# Patient Record
Sex: Female | Born: 1965 | Race: White | Hispanic: No | Marital: Married | Smoking: Former smoker
Health system: Southern US, Community
[De-identification: ages and names within clinical notes are randomized; demographics above are authoritative.]

## PROBLEM LIST (undated history)

## (undated) DIAGNOSIS — T7840XA Allergy, unspecified, initial encounter: Secondary | ICD-10-CM

## (undated) DIAGNOSIS — F419 Anxiety disorder, unspecified: Secondary | ICD-10-CM

## (undated) DIAGNOSIS — E079 Disorder of thyroid, unspecified: Secondary | ICD-10-CM

## (undated) HISTORY — DX: Anxiety disorder, unspecified: F41.9

## (undated) HISTORY — DX: Allergy, unspecified, initial encounter: T78.40XA

## (undated) HISTORY — PX: ENDOMETRIAL BIOPSY: SHX622

## (undated) HISTORY — PX: DILATION AND EVACUATION: SHX1459

## (undated) HISTORY — PX: LAPAROSCOPY: SHX197

## (undated) HISTORY — DX: Disorder of thyroid, unspecified: E07.9

---

## 1998-04-27 ENCOUNTER — Other Ambulatory Visit: Admission: RE | Admit: 1998-04-27 | Discharge: 1998-04-27 | Payer: Self-pay | Admitting: Gynecology

## 1999-05-20 ENCOUNTER — Encounter (INDEPENDENT_AMBULATORY_CARE_PROVIDER_SITE_OTHER): Payer: Self-pay | Admitting: Specialist

## 1999-05-20 ENCOUNTER — Other Ambulatory Visit: Admission: RE | Admit: 1999-05-20 | Discharge: 1999-05-20 | Payer: Self-pay | Admitting: Gynecology

## 2000-04-04 ENCOUNTER — Other Ambulatory Visit: Admission: RE | Admit: 2000-04-04 | Discharge: 2000-04-04 | Payer: Self-pay | Admitting: Obstetrics and Gynecology

## 2000-05-19 ENCOUNTER — Ambulatory Visit (HOSPITAL_COMMUNITY): Admission: RE | Admit: 2000-05-19 | Discharge: 2000-05-19 | Payer: Self-pay | Admitting: Obstetrics and Gynecology

## 2000-05-19 ENCOUNTER — Encounter: Payer: Self-pay | Admitting: Obstetrics and Gynecology

## 2000-10-13 ENCOUNTER — Inpatient Hospital Stay (HOSPITAL_COMMUNITY): Admission: AD | Admit: 2000-10-13 | Discharge: 2000-10-13 | Payer: Self-pay | Admitting: Obstetrics and Gynecology

## 2000-10-19 ENCOUNTER — Inpatient Hospital Stay (HOSPITAL_COMMUNITY): Admission: AD | Admit: 2000-10-19 | Discharge: 2000-10-21 | Payer: Self-pay | Admitting: Obstetrics and Gynecology

## 2000-10-19 ENCOUNTER — Encounter (INDEPENDENT_AMBULATORY_CARE_PROVIDER_SITE_OTHER): Payer: Self-pay

## 2000-12-20 ENCOUNTER — Encounter: Payer: Self-pay | Admitting: Obstetrics and Gynecology

## 2000-12-20 ENCOUNTER — Ambulatory Visit (HOSPITAL_COMMUNITY): Admission: RE | Admit: 2000-12-20 | Discharge: 2000-12-20 | Payer: Self-pay | Admitting: Obstetrics and Gynecology

## 2002-04-02 ENCOUNTER — Other Ambulatory Visit: Admission: RE | Admit: 2002-04-02 | Discharge: 2002-04-02 | Payer: Self-pay | Admitting: Gynecology

## 2003-05-19 ENCOUNTER — Other Ambulatory Visit: Admission: RE | Admit: 2003-05-19 | Discharge: 2003-05-19 | Payer: Self-pay | Admitting: Gynecology

## 2004-09-10 ENCOUNTER — Other Ambulatory Visit: Admission: RE | Admit: 2004-09-10 | Discharge: 2004-09-10 | Payer: Self-pay | Admitting: Gynecology

## 2005-02-10 ENCOUNTER — Ambulatory Visit (HOSPITAL_COMMUNITY): Admission: RE | Admit: 2005-02-10 | Discharge: 2005-02-10 | Payer: Self-pay | Admitting: Obstetrics and Gynecology

## 2005-02-10 ENCOUNTER — Encounter (INDEPENDENT_AMBULATORY_CARE_PROVIDER_SITE_OTHER): Payer: Self-pay | Admitting: Specialist

## 2005-10-31 ENCOUNTER — Other Ambulatory Visit: Admission: RE | Admit: 2005-10-31 | Discharge: 2005-10-31 | Payer: Self-pay | Admitting: Gynecology

## 2006-12-11 ENCOUNTER — Other Ambulatory Visit: Admission: RE | Admit: 2006-12-11 | Discharge: 2006-12-11 | Payer: Self-pay | Admitting: Gynecology

## 2010-06-18 NOTE — Discharge Summary (Signed)
[facility]  Patient:    Kaitlin Black, Kaitlin Black Visit Number: [id number] MRN: [id number]          Service Type: OBS Location: 910A [id number] [id number] Attending Physician:  Michaele Offer Dictated by:   Malachi Pro. Ambrose Mantle, M.D. Admit Date:  10/19/2000 Discharge Date: 10/21/2000                             Discharge Summary  HISTORY OF PRESENT ILLNESS:   This is a 45 year old white female, para 0-0-2-0, gravida 3, 40 weeks by estimated gestational age by intrauterine insemination on January 27, 2000, who presented for induction with a favorable cervix. The patient had good fetal movement, no vaginal bleeding, or ruptured membranes and had occasional contractions.  PRENATAL CARE:                Advanced maternal age with borderline increased maternal serum alpha-fetoprotein with normal ultrasound and declined amniocentesis. AntiTPO antibodies, was on baby aspirin and prednisone first trimester. ANA negative. TSH normal. The patient had mildly increased blood pressures since September 13 with blood pressures of 130-140/80-90 with normal labs and no symptoms.  Blood group and type: B positive with a negative antibody, RPR nonreactive, rubella immune, hepatitis B surface antigen negative, HIV negative, GC and Chlamydia negative, one-hour glucola 108, group B strep was negative.  OBSTETRIC HISTORY:            In 1995 the patient had an early abortion. In 2001 she had a spontaneous abortion after IVF, questionably due to antiTPO antibodies.  GYNECOLOGIC HISTORY:          The patient had a history of Chlamydia. She had a cone biopsy in 1988. She had a history of anxiety. She had a fractured left arm, fractured right arm x 2, and fractured left ankle.  PAST SURGICAL HISTORY:        Cone biopsy, laparoscopy for endometriosis, and D&E.  ALLERGIES:                    PENICILLIN, CODEINE.  MEDICATIONS:                  No medications except prenatal  vitamins.  SOCIAL HISTORY:               Married, no tobacco.  PHYSICAL EXAMINATION:         On admission the patient was afebrile. Vital signs were normal. Fetal heart tones were reactive. There were rare contractions. The abdomen was gravida and nontender. Estimated fetal weight 7-1/2 to 8 pounds. Vaginal exam: The cervix was 2 to 3 cm, 90%, vertex at a 0 to -1 station. Pelvis was adequate. Artificial rupture of membranes produced moderate meconium. Intrauterine pressure catheter was placed.  IMPRESSIONS ON ADMISSION:     1. Intrauterine pregnancy at 40 weeks.                               2. Artificial rupture of membranes done                                  induction.  HOSPITAL COURSE:              The intrauterine pressure catheter was placed in case amnioinfusion was necessary for variable decelerations. The patient  progressed in labor. She received an epidural. By 12:35 p.m. the cervix was 5 to 6 cm dilated. At 5:10 p.m. the cervix was 8 to 9 cm dilated, and 7:10 p.m. the cervix was fully dilated. The patient pushed well and delivered spontaneously, by Dr. Jackelyn Knife, a living female infant, 8 pounds 10 ounces with Apgars of 4 at one, 6 at five, and 8 at ten minutes, over a second degree midline episiotomy with partial third degree extension. DeLee suction was done on the perineum with moderate meconium. The NICU team was in attendance. Mild shoulder dystocia resolved with McRoberts and suprapubic pressure. The placenta was spontaneous and intact, meconium stained. The sphincter was repaired with 2-0 Vicryl, second degree midline episiotomy repaired with 3-0 Vicryl. Blood loss was less than 500 cc. Arterial cord pH was 7.22.  Postpartum the patient did well. She did have one temperature elevation late in labor of 100.5 degrees. Her white count rose after delivery to  25,100. She continued to remain afebrile and had no problems. Repeat white count has shown partial resolution of  the leukocytosis, and on the second postpartum day she was discharged.  LABORATORY DATA:              Initial hemoglobin was 11.8, hematocrit 33.8, and white count 9700, platelet count 178,000. Followup white counts were 25,100 and 15,200 with hemoglobins 11.0 and 10.3, hematocrits 30.8, and 29.4, and platelet counts 173,000 and 164,000, respectively. RPR was nonreactive.  FINAL DIAGNOSES:             1. Intrauterine pregnancy at 40 weeks.                               2. Meconium stained.                               3. Shoulder dystocia.  OPERATIONS:                   Spontaneous vaginal delivery, midline episiotomy and partial third degree extension and repair.  FINAL CONDITION:              Improved.  DISCHARGE INSTRUCTIONS:       Instructions include our regular discharge instruction booklet.  DISCHARGE MEDICATIONS:        The patient is given a prescription for Percocet 5/325, 16 tablets, one every four to six hours as needed for pain.  DISCHARGE FOLLOWUP:           The patient is asked to return to the office in six weeks for followup examination. Dictated by:   Malachi Pro. Ambrose Mantle, M.D. Attending Physician:  Michaele Offer DD:  10/21/00 TD:  10/21/00 Job: [id number]

## 2010-06-18 NOTE — H&P (Signed)
Kaitlin Black, Kaitlin Black NO.:  [id number]   MEDICAL RECORD NO.:  [id number]          PATIENT TYPE:  AMB   LOCATION:  SDC                           FACILITY:  [facility]   PHYSICIAN:  Zenaida Niece, M.D.DATE OF BIRTH:  05-Mar-1965   DATE OF ADMISSION:  02/10/2005  DATE OF DISCHARGE:                                HISTORY & PHYSICAL   CHIEF COMPLAINT:  Missed abortion.   HISTORY OF PRESENT ILLNESS:  This is a 45 year old white female, gravida 4,  para 1-0-2-1, with an EGA of [redacted] weeks by prior ultrasound, who presented to  our office for new OB workup on January 8.  At that time we were unable to  hear fetal heart tones and ultrasound confirmed a missed abortion.  All  options were discussed with the patient, and she wishes to proceed with  dilation and evacuation.   PAST OBSTETRICAL HISTORY:  One elective termination and one spontaneous  abortion after in vitro fertilization.  In 2002 she had a vaginal delivery  at 40 weeks, 8 pounds 10 ounces, complicated by mild shoulder dystocia.   PAST GYNECOLOGIC HISTORY:  A remote history of Chlamydia and a cone biopsy  in 1988.   PAST MEDICAL HISTORY:  Hashimoto's thyroiditis with persistent  hypothyroidism, anxiety.   PAST SURGICAL HISTORY:  Laparoscopy, D&E and cervical cone biopsy.   ALLERGIES:  PENICILLIN causes edema and CODEINE causes vomiting.   CURRENT MEDICATIONS:  Synthroid, Cytomel and Prozac.   SOCIAL HISTORY:  The patient is married and denies alcohol, tobacco or drug  use.   FAMILY HISTORY:  Noncontributory.   PHYSICAL EXAMINATION:  GENERAL:  This is a well-developed female in no acute  distress.  VITAL SIGNS:  Weight is 172 pounds, blood pressure is 110/60.  NECK:  Supple without lymphadenopathy or thyromegaly.  LUNGS:  Clear to auscultation.  CARDIAC:  Regular rate and rhythm without murmur.  ABDOMEN:  Soft, nontender, nondistended, without palpable masses.  EXTREMITIES:  Nontender and have no edema.  PELVIC:  The uterus is slightly enlarged and the cervix is closed, and she  has no adnexal masses.   ASSESSMENT:  Missed abortion at approximately 10 weeks.  Again, all options  have been discussed with the patient and she wishes to proceed with dilation  and evacuation.  The risks of surgery have been discussed.  The patient's  blood type is B positive.   PLAN:  Admit the patient on the day of surgery for a dilation and  evacuation.      Zenaida Niece, M.D.  Electronically Signed     TDM/MEDQ  D:  02/09/2005  T:  02/09/2005  Job:  [id number]

## 2010-06-18 NOTE — Op Note (Signed)
NAMELABERTA, WILBON NO.:  [id number]   MEDICAL RECORD NO.:  [id number]          PATIENT TYPE:  AMB   LOCATION:  SDC                           FACILITY:  [facility]   PHYSICIAN:  Zenaida Niece, M.D.DATE OF BIRTH:  Sep 02, 1965   DATE OF PROCEDURE:  02/10/2005  DATE OF DISCHARGE:                                 OPERATIVE REPORT   PREOPERATIVE DIAGNOSIS:  Missed abortion.   POSTOPERATIVE DIAGNOSIS:  Missed abortion.   PROCEDURE:  Dilation and evacuation.   SURGEON:  Zenaida Niece, M.D.   ANESTHESIA:  IV sedation and local with paracervical block.   SPECIMENS:  Products of conception.   ESTIMATED BLOOD LOSS:  50 mL.   COMPLICATIONS:  None.   FINDINGS:  Slightly enlarged uterus and a closed cervix.  Moderate products  of conception were obtained.   PROCEDURE IN DETAIL:  The patient was taken to the operating room and placed  in the dorsal supine position.  She was given IV sedation and placed in  mobile stirrups.  Perineum and vagina were then prepped and draped in the  usual sterile fashion and bladder drained with a red rubber catheter.  A  Graves speculum was inserted into the vagina and the anterior lip of the  cervix was grasped with a single-tooth tenaculum.  Paracervical block was  then performed with 16 mL of 2% plain lidocaine.  Uterus then sounded to  approximately 10 cm and cervix was gradually easily dilated to a size 27  dilator.  A size 9 curved suction curette was inserted and suction curettage  performed with return of products of conception.  Sharp curettage was then  performed with return of a small amount of tissue.  Suction curettage was  again performed with return of no significant tissue.  The single-tooth  tenaculum was removed and bleeding controlled with pressure.  All  instruments were then removed from the vagina.  The patient tolerated the  procedure well and was taken to the recovery room in stable condition.  Counts were  correct.      Zenaida Niece, M.D.  Electronically Signed     TDM/MEDQ  D:  02/10/2005  T:  02/10/2005  Job:  [id number]

## 2011-04-23 ENCOUNTER — Ambulatory Visit (INDEPENDENT_AMBULATORY_CARE_PROVIDER_SITE_OTHER): Payer: BC Managed Care – PPO | Admitting: Family Medicine

## 2011-04-23 VITALS — BP 135/83 | HR 77 | Temp 98.9°F | Resp 16 | Ht 64.0 in | Wt 177.0 lb

## 2011-04-23 DIAGNOSIS — B0052 Herpesviral keratitis: Secondary | ICD-10-CM

## 2011-04-23 DIAGNOSIS — H5789 Other specified disorders of eye and adnexa: Secondary | ICD-10-CM

## 2011-04-23 MED ORDER — TRIFLURIDINE 1 % OP SOLN: 1.0000 [drp] | OPHTHALMIC | Status: AC

## 2011-04-23 NOTE — Progress Notes (Signed)
  Subjective:    Patient ID: Kaitlin Black, female    DOB: 01-Nov-1965, 46 y.o.   MRN: [id number]  HPI 46 yo female with eye complaint. Left eye felt funy Wednesday.  Son had pink eye last week.  She tried left over drops, no change.  Red since Wed.  Feels gritty, sore.  Normal vision.  No matting but waters constantly.   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 Had an episode 5 years ago.  Saw Dr. Dione Booze.  No issue since.   Review of Systems Negative except as per HPI     Objective:   Physical Exam  Constitutional: She appears well-developed.  Eyes: Left conjunctiva is injected.       On fluorescein staining, small (non-ulcerated) dendritis lesion seen centrally.    No foreign body.   No pain with EOM  Diffuse watering and injection and ciliary flush evident  Pulmonary/Chest: Effort normal.          Assessment & Plan:  HSV keratitis.  Topical viroptic.  To Dr. Dione Booze on Monday morning.

## 2012-09-26 ENCOUNTER — Other Ambulatory Visit: Payer: Self-pay | Admitting: Gynecology

## 2014-03-07 ENCOUNTER — Other Ambulatory Visit: Payer: Self-pay

## 2014-03-10 LAB — CYTOLOGY - PAP

## 2014-03-24 ENCOUNTER — Other Ambulatory Visit: Payer: Self-pay | Admitting: Gynecology

## 2014-06-08 ENCOUNTER — Ambulatory Visit (INDEPENDENT_AMBULATORY_CARE_PROVIDER_SITE_OTHER): Payer: BC Managed Care – PPO | Admitting: Physician Assistant

## 2014-06-08 ENCOUNTER — Telehealth: Payer: Self-pay | Admitting: Physician Assistant

## 2014-06-08 VITALS — BP 118/72 | HR 71 | Temp 98.5°F | Resp 18 | Ht 64.5 in | Wt 176.0 lb

## 2014-06-08 DIAGNOSIS — M62830 Muscle spasm of back: Secondary | ICD-10-CM

## 2014-06-08 MED ORDER — CYCLOBENZAPRINE HCL 5 MG PO TABS: 5.0000 mg | ORAL_TABLET | Freq: Three times a day (TID) | ORAL | Status: DC | PRN

## 2014-06-08 NOTE — Patient Instructions (Signed)
You have a muscle spasm.  Applying heat several times per day, getting light massage to the area 1-2 times per day and taking the flexeril every 8 hours as needed should help with the spasm. You can continue to take the ibuprofen as needed. Once you start feeling better, light range of motion and a massage should help.   Muscle Cramps and Spasms Muscle cramps and spasms occur when a muscle or muscles tighten and you have no control over this tightening (involuntary muscle contraction). They are a common problem and can develop in any muscle. The most common place is in the calf muscles of the leg. Both muscle cramps and muscle spasms are involuntary muscle contractions, but they also have differences:   Muscle cramps are sporadic and painful. They may last a few seconds to a quarter of an hour. Muscle cramps are often more forceful and last longer than muscle spasms.  Muscle spasms may or may not be painful. They may also last just a few seconds or much longer. CAUSES  It is uncommon for cramps or spasms to be due to a serious underlying problem. In many cases, the cause of cramps or spasms is unknown. Some common causes are:   Overexertion.   Overuse from repetitive motions (doing the same thing over and over).   Remaining in a certain position for a long period of time.   Improper preparation, form, or technique while performing a sport or activity.   Dehydration.   Injury.   Side effects of some medicines.   Abnormally low levels of the salts and ions in your blood (electrolytes), especially potassium and calcium. This could happen if you are taking water pills (diuretics) or you are pregnant.  Some underlying medical problems can make it more likely to develop cramps or spasms. These include, but are not limited to:   Diabetes.   Parkinson disease.   Hormone disorders, such as thyroid problems.   Alcohol abuse.   Diseases specific to muscles, joints, and bones.    Blood vessel disease where not enough blood is getting to the muscles.  HOME CARE INSTRUCTIONS   Stay well hydrated. Drink enough water and fluids to keep your urine clear or pale yellow.  It may be helpful to massage, stretch, and relax the affected muscle.  For tight or tense muscles, use a warm towel, heating pad, or hot shower water directed to the affected area.  If you are sore or have pain after a cramp or spasm, applying ice to the affected area may relieve discomfort.  Put ice in a plastic bag.  Place a towel between your skin and the bag.  Leave the ice on for 15-20 minutes, 03-04 times a day.  Medicines used to treat a known cause of cramps or spasms may help reduce their frequency or severity. Only take over-the-counter or prescription medicines as directed by your caregiver. SEEK MEDICAL CARE IF:  Your cramps or spasms get more severe, more frequent, or do not improve over time.  MAKE SURE YOU:   Understand these instructions.  Will watch your condition.  Will get help right away if you are not doing well or get worse. Document Released: 07/09/2001 Document Revised: 05/14/2012 Document Reviewed: 01/04/2012 [facility] Patient Information 2015 [employer] [place]. This information is not intended to replace advice given to you by your health care provider. Make sure you discuss any questions you have with your health care provider.

## 2014-06-08 NOTE — Telephone Encounter (Signed)
She can cycle between tylenol and ibuprofen every 4 hours. She likely needs to give the flexeril more time to work. I do not think she needs tramadol or opiates at this time. If it continues to bother her despite the tylenol/ibuprofen and flexeril after a few doses she should come back to see us.

## 2014-06-08 NOTE — Telephone Encounter (Signed)
Patient had an OV today for back pain. States that her medication is not working and she is still in pain. She is requesting something else. [facility]  [phone]

## 2014-06-08 NOTE — Progress Notes (Addendum)
   Subjective:    Patient ID: Kaitlin Black, female    DOB: [birth year], 49 y.o.   MRN: [id number]  Chief Complaint  Patient presents with  . Back Pain    spasm since yesterday   . Back Injury   There are no active problems to display for this patient.  Medications, allergies, past medical history, surgical history, family history, social history and problem list reviewed and updated.  HPI  [age] yof presents with left sided back pain.   Exercised as normal yest morning. Later in morning when was mopping left sided back pain began. Has been constant past day. Unable to sleep last night or get comfortable. Had similar deal 6 years ago and had muscle spasm. Taking ibuprofen with minimal relief. Denies shooting pains. Denies radiation. Denies bowel/bladder dysfx.   Review of Systems No fevers, chills.     Objective:   Physical Exam  Constitutional: She is oriented to person, place, and time. She appears well-developed and well-nourished.  Non-toxic appearance. She does not have a sickly appearance. She does not appear ill. No distress.  BP 118/72 mmHg  Pulse 71  Temp(Src) 98.5 F (36.9 C) (Oral)  Resp 18  Ht 5' 4.5" (1.638 m)  Wt 176 lb (79.833 kg)  BMI 29.75 kg/m2  SpO2 98%   Musculoskeletal:       Thoracic back: She exhibits no tenderness and no bony tenderness.       Lumbar back: She exhibits tenderness and spasm. She exhibits no bony tenderness.       Back:  Spasm left lumbar paraspinals. No spinal ttp. Decreased rom due to pain.   Neurological: She is alert and oriented to person, place, and time.      Assessment & Plan:   [age] yof presents with left sided back pain.   Muscle spasm of back - Plan: cyclobenzaprine (FLEXERIL) 5 MG tablet --heat, massage, flexeril tid prn --light range of motion in few days --rtc if not improving one week  Donnajean Lopesodd M. Wali Reinheimer, PA-C Physician Assistant-Certified [facility]  06/08/2014 10:18  AM  I have participated in the care of this patient with the Advanced Practice Provider and agree with Diagnosis and Plan as documented. Robert P. Merla Richesoolittle, M.D.

## 2014-06-09 NOTE — Telephone Encounter (Signed)
Left detailed message on pt's voicemail. [place]. Advised to cll back if any questions.

## 2014-06-16 ENCOUNTER — Ambulatory Visit (INDEPENDENT_AMBULATORY_CARE_PROVIDER_SITE_OTHER): Payer: BC Managed Care – PPO

## 2014-06-16 ENCOUNTER — Ambulatory Visit (INDEPENDENT_AMBULATORY_CARE_PROVIDER_SITE_OTHER): Payer: BC Managed Care – PPO | Admitting: Physician Assistant

## 2014-06-16 VITALS — BP 128/84 | HR 95 | Temp 98.4°F | Resp 18 | Ht 63.5 in | Wt 177.0 lb

## 2014-06-16 DIAGNOSIS — M62830 Muscle spasm of back: Secondary | ICD-10-CM

## 2014-06-16 DIAGNOSIS — M5418 Radiculopathy, sacral and sacrococcygeal region: Secondary | ICD-10-CM

## 2014-06-16 DIAGNOSIS — M5410 Radiculopathy, site unspecified: Secondary | ICD-10-CM

## 2014-06-16 DIAGNOSIS — M545 Low back pain, unspecified: Secondary | ICD-10-CM

## 2014-06-16 LAB — POCT URINALYSIS DIPSTICK
Bilirubin, UA: NEGATIVE
Blood, UA: NEGATIVE
Glucose, UA: NEGATIVE
KETONES UA: NEGATIVE
LEUKOCYTES UA: NEGATIVE
Nitrite, UA: NEGATIVE
PH UA: 8
PROTEIN UA: 8
SPEC GRAV UA: 1.015
UROBILINOGEN UA: 0.2

## 2014-06-16 LAB — POCT UA - MICROSCOPIC ONLY
Casts, Ur, LPF, POC: NEGATIVE
Crystals, Ur, HPF, POC: NEGATIVE
Mucus, UA: NEGATIVE
Yeast, UA: NEGATIVE

## 2014-06-16 MED ORDER — CYCLOBENZAPRINE HCL 10 MG PO TABS: 10.0000 mg | ORAL_TABLET | Freq: Every day | ORAL | Status: DC

## 2014-06-16 MED ORDER — PREDNISONE 20 MG PO TABS: ORAL_TABLET | ORAL | Status: DC

## 2014-06-16 NOTE — Progress Notes (Signed)
06/16/2014 at 6:04 PM  Kaitlin Black / DOB: [birth year] / MRN: [id number]  The patient  does not have a problem list on file.  SUBJECTIVE  Chief complaint: Back Pain   Kaitlin Black is a 49 y.o. female complaining of gradually improving "sharp" left lumbar back pain that started 9 days ago. Associated symptoms include abdominal pain, and she denies weakness, numbness, tingling, dysuria, leg pain, leg weakness.Treatments tried thus far include OTC NSAIDS and muscle relaxers with fair  relief. She denies fever, nausea, dysuria, frequency and urgency, and bowel or bladder incontenence.  She denies a history of cancer.  She just received her first colonoscopy and she was told everything was normal and to return in 8 years. She receives a mammogram yearly and has not had any abnormal results. She is a non smoker today but has in the past and has a 2.5 pack year history of smoking. She denies weight loss.    She  has a past medical history of Allergy; Anxiety; and Thyroid disease.    Medications reviewed and updated by myself where necessary, and exist elsewhere in the encounter.   Ms. Kaitlin Black is allergic to codeine and penicillins. She  reports that she quit smoking about 16 years ago. Her smoking use included Cigarettes. She has a 5 pack-year smoking history. She does not have any smokeless tobacco history on file. She  has no sexual activity history on file. The patient  has past surgical history that includes Endometrial biopsy.  Her family history includes Cancer in her maternal grandmother and mother; Diabetes in her maternal grandfather and sister; Heart disease in her sister; Hyperlipidemia in her father and sister; Hypertension in her father.  Review of Systems  Constitutional: Negative for fever and chills.  Eyes: Negative for blurred vision.  Respiratory: Negative for cough.   Cardiovascular: Negative for chest pain and palpitations.  Gastrointestinal: Positive for  constipation.  Genitourinary: Negative for dysuria, urgency, frequency, hematuria and flank pain.  Musculoskeletal: Positive for myalgias and back pain. Negative for joint pain, falls and neck pain.  Skin: Negative for itching and rash.  Neurological: Negative for dizziness and headaches.  Psychiatric/Behavioral: Negative for depression.    OBJECTIVE  Her  height is 5' 3.5" (1.613 m) and weight is 177 lb (80.287 kg). Her temperature is 98.4 F (36.9 C). Her blood pressure is 128/84 and her pulse is 95. Her respiration is 18 and oxygen saturation is 97%.  The patient's body mass index is 30.86 kg/(m^2).  Physical Exam  Constitutional: She is oriented to person, place, and time. She appears well-developed and well-nourished. No distress.  Cardiovascular: Normal rate.   Respiratory: Effort normal.  Musculoskeletal: Normal range of motion.       Lumbar back: She exhibits tenderness, pain and spasm. She exhibits no bony tenderness, no swelling, no edema, no deformity and normal pulse.       Back:  Neurological: She is alert and oriented to person, place, and time. She has normal strength. She displays no atrophy and no tremor. No cranial nerve deficit or sensory deficit. She exhibits normal muscle tone. She displays a negative Romberg sign. She displays no seizure activity. Coordination and gait normal. She displays no Babinski's sign on the right side. She displays no Babinski's sign on the left side.  Reflex Scores:      Patellar reflexes are 2+ on the right side and 3+ on the left side.      Achilles reflexes are 2+  on the right side and 3+ on the left side. Heel, toe and tandem walking, vibratory and temperature sense intact bilaterally in the LEE.    Skin: Skin is warm and dry. She is not diaphoretic.  Psychiatric: She has a normal mood and affect.    Results for orders placed or performed in visit on 06/16/14 (from the past 24 hour(s))  POCT urinalysis dipstick     Status: None    Collection Time: 06/16/14  5:30 PM  Result Value Ref Range   Color, UA yellow    Clarity, UA clear    Glucose, UA negative    Bilirubin, UA negative    Ketones, UA negative    Spec Grav, UA 1.015    Blood, UA negative    pH, UA 8.0    Protein, UA 8.0    Urobilinogen, UA 0.2    Nitrite, UA negative    Leukocytes, UA Negative   POCT UA - Microscopic Only     Status: None   Collection Time: 06/16/14  5:30 PM  Result Value Ref Range   WBC, Ur, HPF, POC 0-3    RBC, urine, microscopic 0-1    Bacteria, U Microscopic trace    Mucus, UA negative    Epithelial cells, urine per micros 1-2    Crystals, Ur, HPF, POC negative    Casts, Ur, LPF, POC negative    Yeast, UA negative    [facility] reading (PRIMARY) by  Dr. Conley RollsLe: No osseous abnormality.   ASSESSMENT & PLAN  [name] was seen today for back pain.  Diagnoses and all orders for this visit:  Right-sided low back pain without sciatica: Advised that if not better in two weeks to return to the clinic and we will consider MRI.  Orders: -     POCT urinalysis dipstick -     POCT UA - Microscopic Only -     DG Lumbar Spine Complete; Future -     predniSONE (DELTASONE) 20 MG tablet; Take 3 PO QAM x3days, 2 PO QAM x3days, 1 PO QAM x3days  Muscle spasm of back Orders: -     cyclobenzaprine (FLEXERIL) 10 MG tablet; Take 1 tablet (10 mg total) by mouth at bedtime.  Radicular pain of sacrum Orders: -     predniSONE (DELTASONE) 20 MG tablet; Take 3 PO QAM x3days, 2 PO QAM x3days, 1 PO QAM x3days    The patient was advised to call or come back to clinic if she does not see an improvement in symptoms, or worsens with the above plan.   Deliah BostonMichael Clark, MHS, PA-C [facility] 06/16/2014 6:04 PM

## 2014-06-20 ENCOUNTER — Other Ambulatory Visit: Payer: Self-pay | Admitting: Physician Assistant

## 2014-09-25 ENCOUNTER — Other Ambulatory Visit: Payer: Self-pay

## 2014-09-26 LAB — CYTOLOGY - PAP

## 2014-12-02 ENCOUNTER — Telehealth: Payer: Self-pay | Admitting: Genetic Counselor

## 2014-12-02 NOTE — Telephone Encounter (Signed)
Lt mess regarding genetic counseling appt. °

## 2014-12-10 ENCOUNTER — Telehealth: Payer: Self-pay | Admitting: Genetic Counselor

## 2014-12-10 NOTE — Telephone Encounter (Signed)
Lt mess for genetic counseling appt

## 2016-09-30 ENCOUNTER — Other Ambulatory Visit: Payer: Self-pay | Admitting: Physician Assistant

## 2017-06-05 ENCOUNTER — Ambulatory Visit: Payer: BC Managed Care – PPO | Admitting: Podiatry

## 2017-06-12 ENCOUNTER — Other Ambulatory Visit: Payer: BC Managed Care – PPO | Admitting: Orthotics

## 2017-06-12 ENCOUNTER — Ambulatory Visit: Payer: BC Managed Care – PPO | Admitting: Podiatry

## 2017-06-19 ENCOUNTER — Encounter: Payer: Self-pay | Admitting: Podiatry

## 2017-06-19 ENCOUNTER — Ambulatory Visit: Payer: BC Managed Care – PPO | Admitting: Podiatry

## 2017-06-19 ENCOUNTER — Ambulatory Visit: Payer: BC Managed Care – PPO | Admitting: Orthotics

## 2017-06-19 ENCOUNTER — Ambulatory Visit (INDEPENDENT_AMBULATORY_CARE_PROVIDER_SITE_OTHER): Payer: BC Managed Care – PPO

## 2017-06-19 VITALS — BP 121/73 | HR 68 | Resp 16

## 2017-06-19 DIAGNOSIS — M722 Plantar fascial fibromatosis: Secondary | ICD-10-CM

## 2017-06-19 DIAGNOSIS — M779 Enthesopathy, unspecified: Secondary | ICD-10-CM

## 2017-06-19 MED ORDER — MELOXICAM 15 MG PO TABS: 15.0000 mg | ORAL_TABLET | Freq: Every day | ORAL | 0 refills | Status: AC

## 2017-06-19 MED ORDER — TRIAMCINOLONE ACETONIDE 10 MG/ML IJ SUSP
10.0000 mg | Freq: Once | INTRAMUSCULAR | Status: AC
  Administered 2017-06-19: 10 mg

## 2017-06-19 NOTE — Patient Instructions (Signed)

## 2017-06-19 NOTE — Progress Notes (Signed)

## 2017-06-21 DIAGNOSIS — M722 Plantar fascial fibromatosis: Secondary | ICD-10-CM | POA: Insufficient documentation

## 2017-06-21 NOTE — Progress Notes (Signed)
Subjective:   Patient ID: Meridee Score, female   DOB: 52 y.o.   MRN: [id number]   HPI 52 year old female presents the office with concerns of bilateral heel pain which is been ongoing for several months.  She says the pain is intermittent in nature.  She has a history of plantar fasciitis and she is interested in getting new orthotics.  She gets tenderness to the bottoms of both of her heels.  Denies any recent injury or trauma.  She is also been getting some aching sensation to the right big toe joint.  She states has been more recently in the last couple weeks but denies any recent injury but she has had this for several years.  She states that sometimes she is aggravated the symptoms.  She has no other concerns today.   Review of Systems  All other systems reviewed and are negative.  Past Medical History:  Diagnosis Date  . Allergy   . Anxiety   . Thyroid disease     Past Surgical History:  Procedure Laterality Date  . ENDOMETRIAL BIOPSY       Current Outpatient Medications:  Marland Kitchen  Multiple Vitamin (MULTIVITAMIN) capsule, Take 1 capsule by mouth daily., Disp: , Rfl:  .  meloxicam (MOBIC) 15 MG tablet, Take 1 tablet (15 mg total) by mouth daily., Disp: 30 tablet, Rfl: 0 .  SYNTHROID 175 MCG tablet, TK 1 T PO QD, Disp: , Rfl: 3  Allergies  Allergen Reactions  . Codeine Nausea And Vomiting  . Penicillins Swelling         Objective:  Physical Exam  General: AAO x3, NAD  Dermatological: Skin is warm, dry and supple bilateral. Nails x 10 are well manicured; remaining integument appears unremarkable at this time. There are no open sores, no preulcerative lesions, no rash or signs of infection present.  Vascular: Dorsalis Pedis artery and Posterior Tibial artery pedal pulses are 2/4 bilateral with immedate capillary fill time. There is no pain with calf compression, swelling, warmth, erythema.   Neruologic: Grossly intact via light touch bilateral. Vibratory intact via  tuning fork bilateral. Protective threshold with Semmes Wienstein monofilament intact to all pedal sites bilateral.  Negative Tinel sign.  Musculoskeletal: Tenderness to palpation along the plantar medial tubercle of the calcaneus at the insertion of plantar fascia on the left and right foot. There is no pain along the course of the plantar fascia within the arch of the foot. Plantar fascia appears to be intact. There is no pain with lateral compression of the calcaneus or pain with vibratory sensation. There is no pain along the course or insertion of the achilles tendon. Minimal discomfort along the right 1st MTPJ but no area of pinpoint bony tenderness.  No other areas of tenderness to bilateral lower extremities. Muscular strength 5/5 in all groups tested bilateral.  Gait: Unassisted, Nonantalgic.       Assessment:   52 year old female with bilateral heel pain, capsulitis right first MPJ     Plan:  -Treatment options discussed including all alternatives, risks, and complications -Etiology of symptoms were discussed -X-rays were obtained and reviewed with the patient.  No evidence of acute fracture or stress fracture identified today. -Injection performed.  See procedure note below. -Prescribed mobic. Discussed side effects of the medication and directed to stop if any are to occur and call the office.  -Stretching, icing exercises daily. -Discussed shoe modifications and orthotics.  She wishes to proceed with orthotics and Raiford Noble evaluate her today she  was molded for them. -Follow-up in 3 weeks or sooner if needed  Vivi Barrack DPM

## 2017-07-10 ENCOUNTER — Other Ambulatory Visit: Payer: BC Managed Care – PPO | Admitting: Orthotics

## 2017-07-11 ENCOUNTER — Ambulatory Visit: Payer: BC Managed Care – PPO | Admitting: Orthotics

## 2017-07-11 DIAGNOSIS — M722 Plantar fascial fibromatosis: Secondary | ICD-10-CM

## 2017-07-11 NOTE — Progress Notes (Signed)
Patient came in today to pick up custom made foot orthotics.  The goals were accomplished and the patient reported no dissatisfaction with said orthotics.  Patient was advised of breakin period and how to report any issues. 

## 2017-08-02 ENCOUNTER — Other Ambulatory Visit: Payer: Self-pay | Admitting: Endocrinology

## 2017-08-04 ENCOUNTER — Other Ambulatory Visit: Payer: Self-pay | Admitting: Endocrinology

## 2017-08-04 DIAGNOSIS — R490 Dysphonia: Secondary | ICD-10-CM

## 2017-08-08 ENCOUNTER — Other Ambulatory Visit: Payer: Self-pay | Admitting: Obstetrics and Gynecology

## 2017-08-08 DIAGNOSIS — Z803 Family history of malignant neoplasm of breast: Secondary | ICD-10-CM

## 2017-08-09 ENCOUNTER — Ambulatory Visit
Admission: RE | Admit: 2017-08-09 | Discharge: 2017-08-09 | Disposition: A | Discharge status: Home / Self Care | Payer: BC Managed Care – PPO | Source: Ambulatory Visit | Attending: Endocrinology | Admitting: Endocrinology

## 2017-08-09 DIAGNOSIS — R490 Dysphonia: Secondary | ICD-10-CM

## 2017-08-14 ENCOUNTER — Other Ambulatory Visit: Payer: Self-pay

## 2017-09-25 ENCOUNTER — Ambulatory Visit (INDEPENDENT_AMBULATORY_CARE_PROVIDER_SITE_OTHER): Payer: BC Managed Care – PPO

## 2017-09-25 ENCOUNTER — Ambulatory Visit
Admission: RE | Admit: 2017-09-25 | Discharge: 2017-09-25 | Disposition: A | Discharge status: Home / Self Care | Payer: BC Managed Care – PPO | Source: Ambulatory Visit | Attending: Obstetrics and Gynecology | Admitting: Obstetrics and Gynecology

## 2017-09-25 ENCOUNTER — Ambulatory Visit: Payer: BC Managed Care – PPO | Admitting: Podiatry

## 2017-09-25 ENCOUNTER — Encounter: Payer: Self-pay | Admitting: Podiatry

## 2017-09-25 DIAGNOSIS — Z803 Family history of malignant neoplasm of breast: Secondary | ICD-10-CM

## 2017-09-25 DIAGNOSIS — M779 Enthesopathy, unspecified: Secondary | ICD-10-CM | POA: Diagnosis not present

## 2017-09-25 DIAGNOSIS — M722 Plantar fascial fibromatosis: Secondary | ICD-10-CM

## 2017-09-25 MED ORDER — GADOBENATE DIMEGLUMINE 529 MG/ML IV SOLN
17.0000 mL | Freq: Once | INTRAVENOUS | Status: AC | PRN
  Administered 2017-09-25: 17 mL via INTRAVENOUS

## 2017-09-25 MED ORDER — METHYLPREDNISOLONE 4 MG PO TBPK: ORAL_TABLET | ORAL | 0 refills | Status: DC

## 2017-09-27 ENCOUNTER — Other Ambulatory Visit: Payer: Self-pay | Admitting: Obstetrics and Gynecology

## 2017-09-27 DIAGNOSIS — R9389 Abnormal findings on diagnostic imaging of other specified body structures: Secondary | ICD-10-CM

## 2017-09-27 NOTE — Progress Notes (Signed)
Subjective: 52 year old female presents the office today for concerns of pain to the right foot.  She is also initially when of her sandals and since then she is been some pain in the ball the right second toe she says her second toe started to drift in the second third toes have been separating.  She is getting some anti-inflammatories and icing which is been helping.  Her heel overall is doing well the more pain she gets the arch of the foot.  Her main concern today is the pain in the ball of her foot.  No injury that she can recall. Denies any systemic complaints such as fevers, chills, nausea, vomiting. No acute changes since last appointment, and no other complaints at this time.   Objective: AAO x3, NAD DP/PT pulses palpable bilaterally, CRT less than 3 seconds Tenderness on the second MPJ of the right foot.  Upon weightbearing evaluation of the second third toes are splaying.  There is no area pinpoint tenderness.  There is no pain or crepitation restriction with MPJ range of motion.  Fashion the arch of the foot.  No pain on the insertion of the calcaneus.  Minimal edema submetatarsal 2 on the right foot but no other areas of swelling. No open lesions or pre-ulcerative lesions.  No pain with calf compression, swelling, warmth, erythema  Assessment: Capsulitis right foot, plantar fasciitis  Plan: -All treatment options discussed with the patient including all alternatives, risks, complications.  -X-rays were obtained reviewed.  No evidence of acute fracture.  Compared to her previous x-rays there is a difference in the position of the second toe. -This is an overuse injury resulting in capsulitis.  I showed her how to tape the toes of the third toe to help hold it in a rectus position and she can also try a toe separator to help push the second toe over from the big toe if needed.  Discussed wearing a stiffer soled shoe.  Ice and elevation.  Medrol Dosepak was prescribed. -Patient encouraged  to call the office with any questions, concerns, change in symptoms.   Vivi BarrackMatthew R Wagoner DPM

## 2017-09-29 ENCOUNTER — Inpatient Hospital Stay: Admission: RE | Admit: 2017-09-29 | Payer: BC Managed Care – PPO | Source: Ambulatory Visit

## 2017-10-04 ENCOUNTER — Other Ambulatory Visit: Payer: Self-pay | Admitting: Obstetrics and Gynecology

## 2017-10-04 ENCOUNTER — Ambulatory Visit
Admission: RE | Admit: 2017-10-04 | Discharge: 2017-10-04 | Disposition: A | Discharge status: Home / Self Care | Payer: BC Managed Care – PPO | Source: Ambulatory Visit | Attending: Obstetrics and Gynecology | Admitting: Obstetrics and Gynecology

## 2017-10-04 DIAGNOSIS — R9389 Abnormal findings on diagnostic imaging of other specified body structures: Secondary | ICD-10-CM

## 2017-10-04 MED ORDER — GADOBUTROL 1 MMOL/ML IV SOLN
8.0000 mL | Freq: Once | INTRAVENOUS | Status: AC | PRN
  Administered 2017-10-04: 8 mL via INTRAVENOUS

## 2017-10-16 ENCOUNTER — Ambulatory Visit: Payer: BC Managed Care – PPO | Admitting: Podiatry

## 2018-03-06 ENCOUNTER — Other Ambulatory Visit: Payer: Self-pay | Admitting: Obstetrics and Gynecology

## 2018-03-06 DIAGNOSIS — R928 Other abnormal and inconclusive findings on diagnostic imaging of breast: Secondary | ICD-10-CM

## 2018-03-26 ENCOUNTER — Other Ambulatory Visit: Payer: BC Managed Care – PPO

## 2018-04-13 ENCOUNTER — Other Ambulatory Visit: Payer: Self-pay

## 2018-04-13 ENCOUNTER — Ambulatory Visit
Admission: RE | Admit: 2018-04-13 | Discharge: 2018-04-13 | Disposition: A | Discharge status: Home / Self Care | Payer: BC Managed Care – PPO | Source: Ambulatory Visit | Attending: Obstetrics and Gynecology | Admitting: Obstetrics and Gynecology

## 2018-04-13 DIAGNOSIS — R928 Other abnormal and inconclusive findings on diagnostic imaging of breast: Secondary | ICD-10-CM

## 2018-04-13 MED ORDER — GADOBENATE DIMEGLUMINE 529 MG/ML IV SOLN: 8.0000 mL | Freq: Once | INTRAVENOUS | Status: DC | PRN

## 2018-04-13 MED ORDER — GADOBUTROL 1 MMOL/ML IV SOLN
8.0000 mL | Freq: Once | INTRAVENOUS | Status: AC | PRN
  Administered 2018-04-13: 8 mL via INTRAVENOUS

## 2018-04-17 ENCOUNTER — Other Ambulatory Visit: Payer: Self-pay | Admitting: Obstetrics and Gynecology

## 2018-04-17 DIAGNOSIS — R9389 Abnormal findings on diagnostic imaging of other specified body structures: Secondary | ICD-10-CM

## 2018-04-25 ENCOUNTER — Ambulatory Visit
Admission: RE | Admit: 2018-04-25 | Discharge: 2018-04-25 | Disposition: A | Discharge status: Home / Self Care | Payer: BC Managed Care – PPO | Source: Ambulatory Visit | Attending: Obstetrics and Gynecology | Admitting: Obstetrics and Gynecology

## 2018-04-25 ENCOUNTER — Other Ambulatory Visit: Payer: Self-pay

## 2018-04-25 DIAGNOSIS — R9389 Abnormal findings on diagnostic imaging of other specified body structures: Secondary | ICD-10-CM

## 2018-04-25 MED ORDER — GADOBUTROL 1 MMOL/ML IV SOLN
8.0000 mL | Freq: Once | INTRAVENOUS | Status: AC | PRN
  Administered 2018-04-25: 8 mL via INTRAVENOUS

## 2018-05-21 ENCOUNTER — Other Ambulatory Visit: Payer: Self-pay

## 2018-05-21 ENCOUNTER — Ambulatory Visit
Admission: RE | Admit: 2018-05-21 | Discharge: 2018-05-21 | Disposition: A | Discharge status: Home / Self Care | Payer: BC Managed Care – PPO | Source: Ambulatory Visit | Attending: Obstetrics and Gynecology | Admitting: Obstetrics and Gynecology

## 2018-05-21 ENCOUNTER — Other Ambulatory Visit: Payer: Self-pay | Admitting: Obstetrics and Gynecology

## 2018-05-21 DIAGNOSIS — N6452 Nipple discharge: Secondary | ICD-10-CM

## 2018-09-02 IMAGING — MR MR BILATERAL BREAST WITHOUT AND WITH CONTRAST
8 of 12 series · 32 of 48 positions shown · IV contrast (17cc multihance)
Comparison: Previous examinations, including the bilateral
screening mammogram obtained elsewhere on 07/31/2017.

CLINICAL DATA: Family history of breast cancer. BRCA positive.
Greater than 20% calculated lifetime risk of developing breast
cancer.

LABS:  None obtained today.
EXAM:
BILATERAL BREAST MRI WITH AND WITHOUT CONTRAST
TECHNIQUE: Multiplanar, multisequence MR images of both breasts were obtained
prior to and following the intravenous administration of 17 ml of
MultiHance.
Three-dimensional MR images were rendered by post-processing the
original MR data using the DynaCAD thin client. The 3D MR images are
interpreted and the findings are included in the complete MRI report
below.

[Series 2: t2_tirm_tra ipat (a-p) · axial · 3.0mm · 0.70mm/px · 1 of 55 slices shown]
[im 1/55]
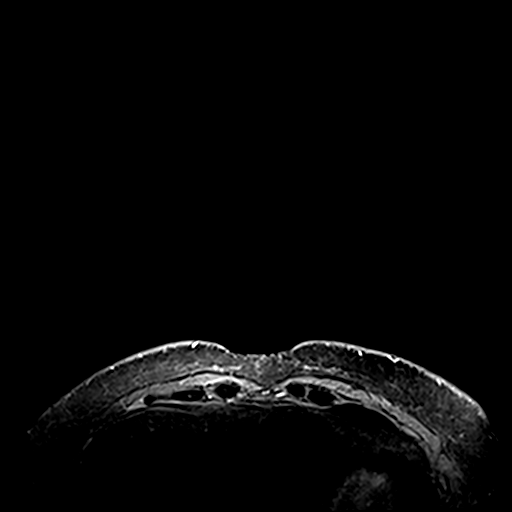

[Series 3: fl3d pre-cm no · axial · non-contrast · 1.2mm · 0.94mm/px · z∈[-74,+97]mm · 5 of 144 slices shown]
[im 1/144]
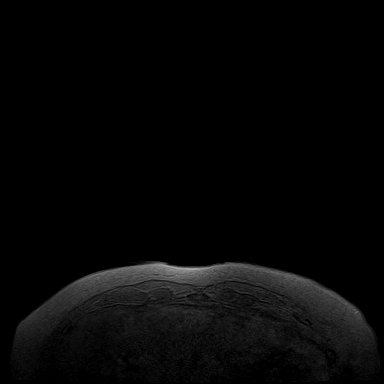
[im 36/144]
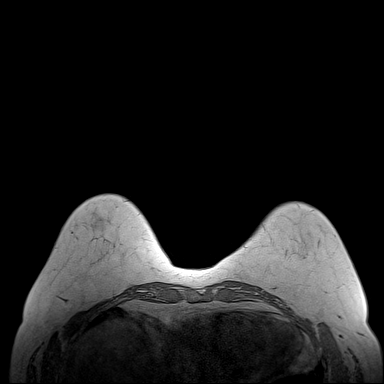
[im 72/144]
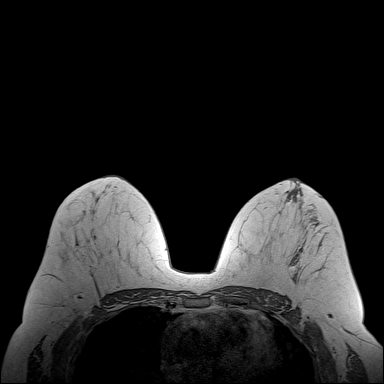
[im 108/144]
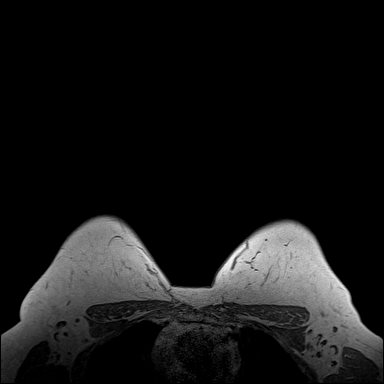
[im 144/144]
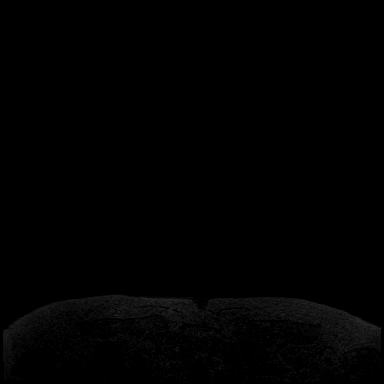

[Series 4: fl3d pre-cm · axial · non-contrast · 1.2mm · 0.94mm/px · z∈[-74,+97]mm · 5 of 144 slices shown]
[im 1/144]
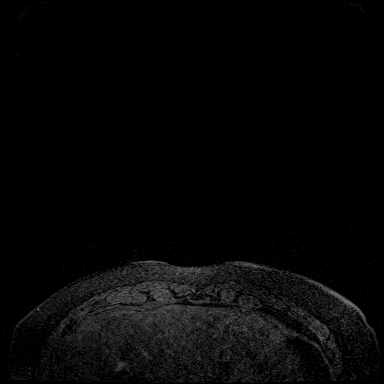
[im 36/144]
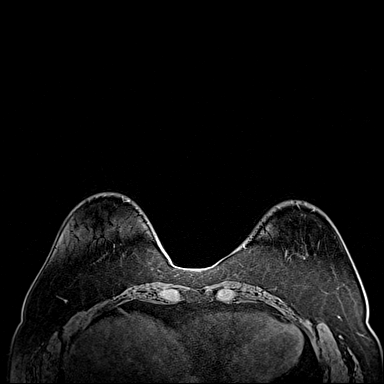
[im 72/144]
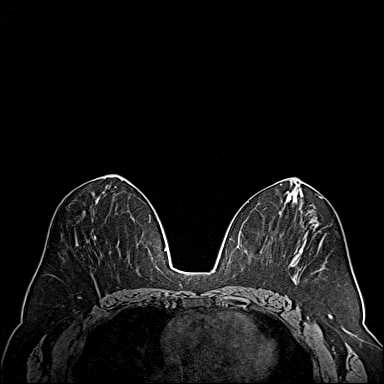
[im 108/144]
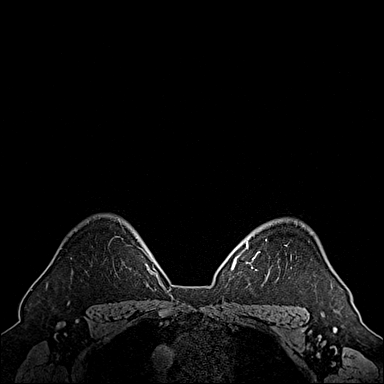
[im 144/144]
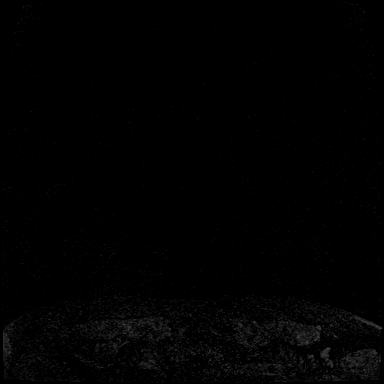

[Series 5: fl3d post immediate · axial · 1.2mm · 0.94mm/px · z∈[-74,+97]mm · 5 of 144 slices shown (1 of 3)]
[im 1/144]
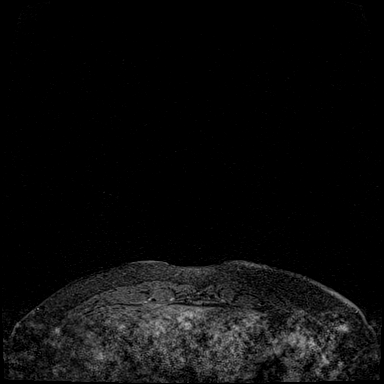
[im 36/144]
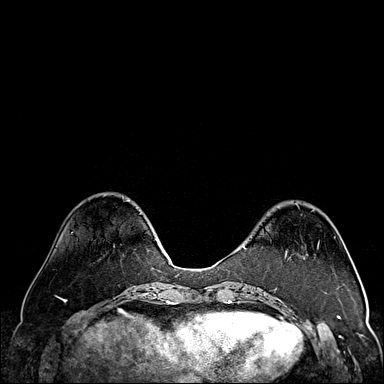
[im 72/144]
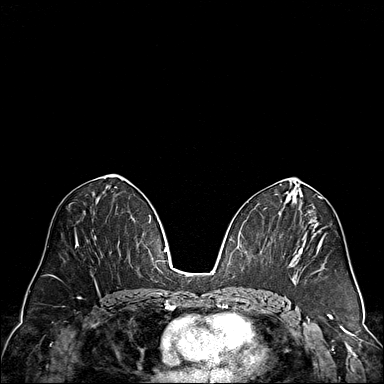
[im 108/144]
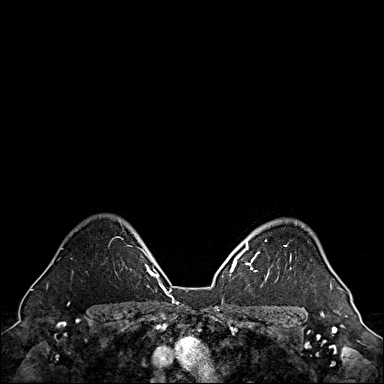
[im 144/144]
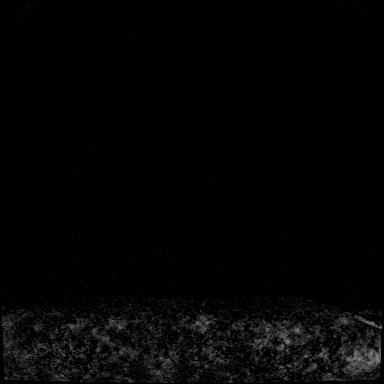

[Series 6: fl3d post immediate · axial · 1.2mm · 0.94mm/px · z∈[-74,+97]mm · 5 of 144 slices shown (2 of 3)]
[im 1/144]
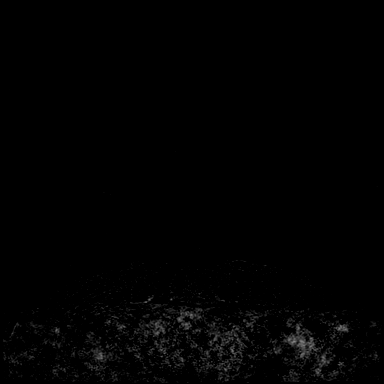
[im 36/144]
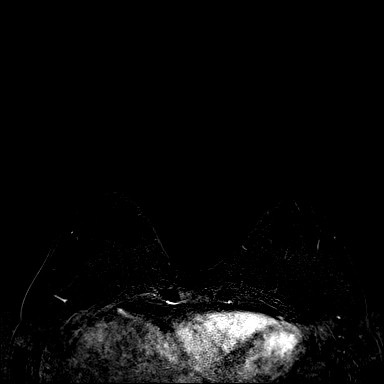
[im 72/144]
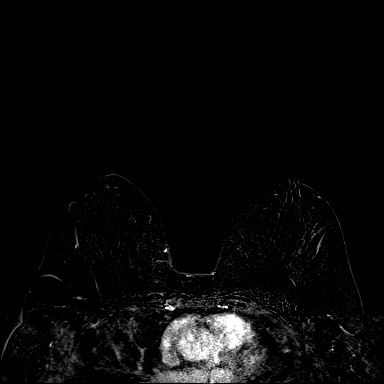
[im 108/144]
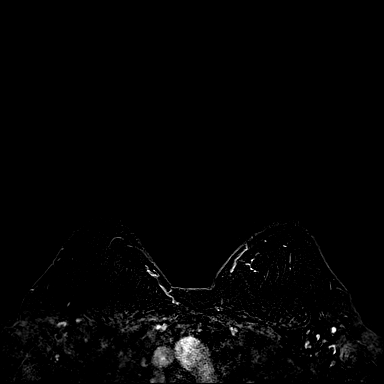
[im 144/144]
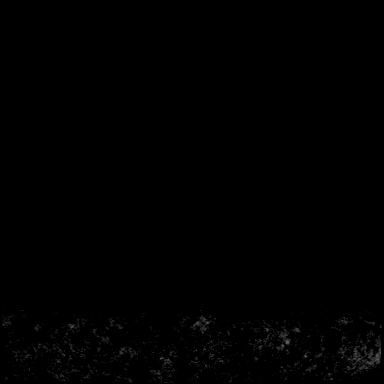

[Series 7: fl3d post immediate · axial · 172.8mm · 0.94mm/px · 1 of 1 slices shown (3 of 3)]
[im 1/1]
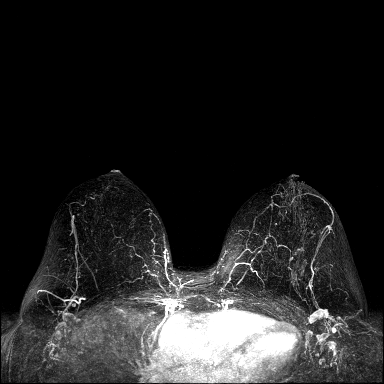

[Series 8: fl3d post 3min · axial · 1.2mm · 0.94mm/px · z∈[-74,+97]mm · 6 of 144 slices shown]
[im 1/144]
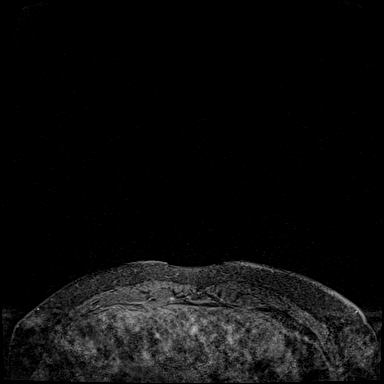
[im 29/144]
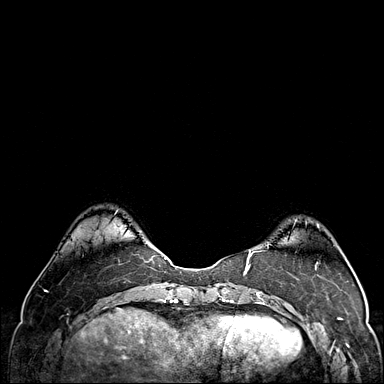
[im 58/144]
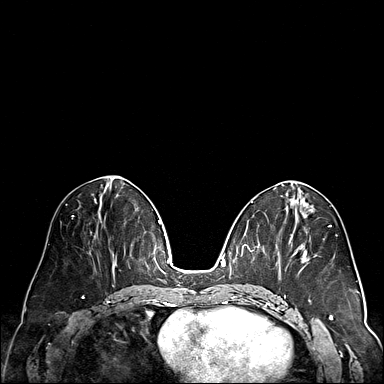
[im 86/144]
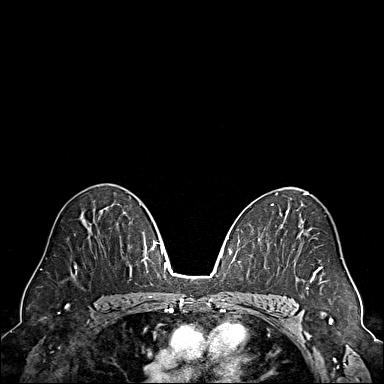
[im 115/144]
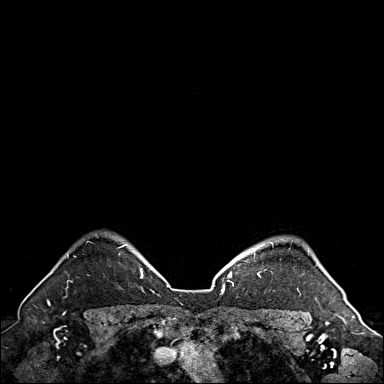
[im 144/144]
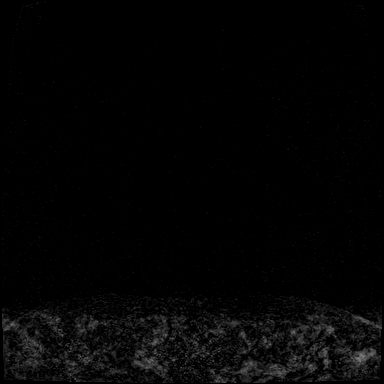

[Series 9: fl3d post 3min_sub · axial · 1.2mm · 0.94mm/px · z∈[-74,+28]mm · 4 of 144 slices shown]
[im 1/144]
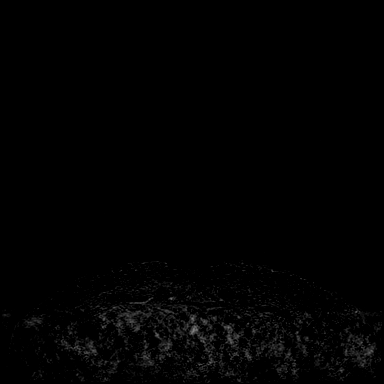
[im 29/144]
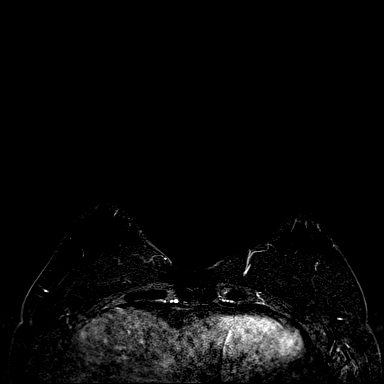
[im 58/144]
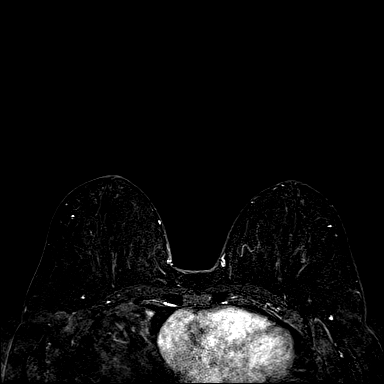
[im 86/144]
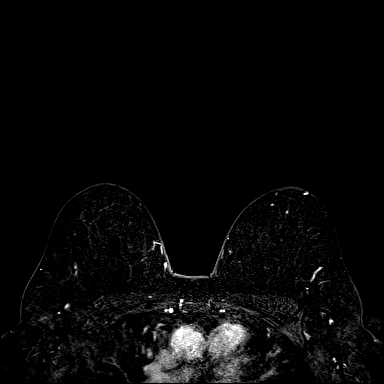

[32 of 48 positions shown; findings below may reference images not displayed]

FINDINGS: Breast composition: b. Scattered fibroglandular tissue.

Background parenchymal enhancement: Minimal.

Right breast: No mass or abnormal enhancement.

Left breast: Small irregular mass in the central left breast in the
lower outer quadrant, measuring 1.0 x 0.7 cm in maximum dimensions
in the transverse plane.

Area of clumped, linear, non mass enhancement in the central left
breast slightly laterally in the 3 o'clock position in the middle
3rd. Including a small oval enhancing area posteriorly, this spans
3.5 x 1.0 cm in the transverse plane.

Lymph nodes: No abnormal appearing lymph nodes.

Ancillary findings:  None.
IMPRESSION: 1. 1.0 cm irregular enhancing mass in the central left breast in the
lower outer quadrant. Differential considerations include a small
malignancy and area of focal fibrocystic change.
2. 3.5 cm area of clumped, linear, non mass enhancement the central
left breast in the 3 o'clock position. Differential considerations
include ductal carcinoma in situ.

RECOMMENDATION:
MR guided needle biopsy of the 1.0 cm irregular enhancing mass in
central left breast in the lower outer quadrant and MR guided core
needle biopsy of the 3.5 cm area of clumped, linear, non mass
enhancement in the central left breast in the 3 o'clock position.

BI-RADS CATEGORY  4: Suspicious.

## 2018-11-20 ENCOUNTER — Other Ambulatory Visit: Payer: Self-pay

## 2018-11-20 DIAGNOSIS — Z20822 Contact with and (suspected) exposure to covid-19: Secondary | ICD-10-CM

## 2018-11-21 LAB — NOVEL CORONAVIRUS, NAA: SARS-CoV-2, NAA: NOT DETECTED

## 2020-02-14 ENCOUNTER — Other Ambulatory Visit: Payer: Self-pay | Admitting: Gastroenterology

## 2020-02-14 DIAGNOSIS — R1011 Right upper quadrant pain: Secondary | ICD-10-CM

## 2020-03-03 ENCOUNTER — Other Ambulatory Visit: Payer: BC Managed Care – PPO

## 2021-06-23 ENCOUNTER — Telehealth: Payer: Self-pay

## 2021-06-23 MED ORDER — MINOCYCLINE HCL 50 MG PO CAPS: 50.0000 mg | ORAL_CAPSULE | Freq: Two times a day (BID) | ORAL | 1 refills | Status: DC

## 2021-06-23 NOTE — Telephone Encounter (Signed)
Patient needs refill on Minocycline- okay per kelli sheffield- sent to patient pharmacy.

## 2022-11-21 ENCOUNTER — Encounter: Payer: Self-pay | Admitting: Diagnostic Neuroimaging

## 2022-11-21 ENCOUNTER — Ambulatory Visit: Payer: BC Managed Care – PPO | Admitting: Diagnostic Neuroimaging

## 2022-11-21 VITALS — BP 113/76 | HR 71 | Ht 64.0 in | Wt 124.0 lb

## 2022-11-21 DIAGNOSIS — R200 Anesthesia of skin: Secondary | ICD-10-CM | POA: Diagnosis not present

## 2022-11-21 DIAGNOSIS — R202 Paresthesia of skin: Secondary | ICD-10-CM | POA: Diagnosis not present

## 2022-11-21 NOTE — Patient Instructions (Addendum)
   BILATERAL FOOT DROP (left worse than right; likely related to external compression at the knees, in the setting of 60lb weight loss over 1 year; improving in the 1 month) - avoid external leg compression; monitor for improvement; if not better in next 4-6 weeks, then consider EMG/NCS or other testing - reviewed foot / ankle exercises; could consider PT as well in future  THORACIC BACK NUMBNESS / COLD SENSATION - check MRI thoracic spine

## 2022-11-21 NOTE — Progress Notes (Signed)
[facility]  PATIENT: Kaitlin Black DOB: 1965/08/30  REFERRING CLINICIAN: Meccariello, Solmon Ice, * HISTORY FROM: patient  REASON FOR VISIT: new consult    HISTORICAL  CHIEF COMPLAINT:  Chief Complaint  Patient presents with   New Patient (Initial Visit)    Rm 7, here alone Pt is here for bilateral leg/foot weakness. Pt states her gait has changed, at times when she is walking her feet drag more so on there right leg/foot. Pt also states she feels a cold sensation across her back every day.      HISTORY OF PRESENT ILLNESS:   57 year old female here for evaluation of lower extremity weakness.  Over past 1 year patient has had 60 pound intentional weight loss using medication zepbound.  Over past 6 to 8 months patient has had intermittent right foot dragging sensation, which has switched to the left side.  This can happen in any situation such as sitting for prolonged time or walking for prolonged time.  Patient has noted numbness on her left lower leg in the lateral shin and top of the foot region.  Patient is also noted some cold water sensation in her upper thoracic region with some discomfort especially if she is standing or leaning over for long time.  Symptoms have slightly improved over the last month.   REVIEW OF SYSTEMS: Full 14 system review of systems performed and negative with exception of: as per HPI.  ALLERGIES: Allergies  Allergen Reactions   Codeine Nausea And Vomiting   Penicillins Swelling    HOME MEDICATIONS: Outpatient Medications Prior to Visit  Medication Sig Dispense Refill   Calcium Carb-Cholecalciferol (CALCIUM 500 + D PO) Take by mouth.     Cholecalciferol 10 MCG (400 UNIT) CAPS Take 400 Units by mouth daily.     escitalopram (LEXAPRO) 10 MG tablet Take 10 mg by mouth daily.     Estradiol-Norethindrone Acet 0.5-0.1 MG tablet Take 1 tablet by mouth daily.     Multiple Vitamin (MULTIVITAMIN) capsule Take 1 capsule by  mouth daily.     SYNTHROID 150 MCG tablet TK 1 T PO QD  3   tirzepatide (ZEPBOUND) 7.5 MG/0.5ML Pen Inject 7.5 mg into the skin once a week.     valACYclovir (VALTREX) 500 MG tablet Take 500 mg by mouth daily.     cyclobenzaprine (AMRIX) 15 MG 24 hr capsule Take 15 mg by mouth daily as needed for muscle spasms. (Patient not taking: Reported on 11/21/2022)     meloxicam (MOBIC) 7.5 MG tablet Take 7.5 mg by mouth daily. (Patient not taking: Reported on 11/21/2022)     methylPREDNISolone (MEDROL DOSEPAK) 4 MG TBPK tablet Take as directed 21 tablet 0   minocycline (MINOCIN) 50 MG capsule Take 1 capsule (50 mg total) by mouth 2 (two) times daily. 30 capsule 1   No facility-administered medications prior to visit.    PAST MEDICAL HISTORY: Past Medical History:  Diagnosis Date   Allergy    Anxiety    Thyroid disease     PAST SURGICAL HISTORY: Past Surgical History:  Procedure Laterality Date   DILATION AND EVACUATION     x2   ENDOMETRIAL BIOPSY     LAPAROSCOPY      FAMILY HISTORY: Family History  Problem Relation Age of Onset   Cancer Mother    Hyperlipidemia Father    Hypertension Father    Diabetes Sister    Heart disease Sister    Hyperlipidemia Sister    Cancer  Maternal Grandmother    Diabetes Maternal Grandfather     SOCIAL HISTORY: Social History   Socioeconomic History   Marital status: Married    Spouse name: Not on file   Number of children: Not on file   Years of education: Not on file   Highest education level: Not on file  Occupational History   Not on file  Tobacco Use   Smoking status: Former    Current packs/day: 0.00    Average packs/day: 0.5 packs/day for 10.0 years (5.0 ttl pk-yrs)    Types: Cigarettes    Start date: 04/22/1988    Quit date: 04/23/1998    Years since quitting: 24.5   Smokeless tobacco: Never  Substance and Sexual Activity   Alcohol use: Not on file   Drug use: Not on file   Sexual activity: Not on file  Other Topics Concern    Not on file  Social History Narrative   Not on file   Social Determinants of Health   Financial Resource Strain: Not on file  Food Insecurity: Not on file  Transportation Needs: Not on file  Physical Activity: Not on file  Stress: Not on file  Social Connections: Not on file  Intimate Partner Violence: Not on file     PHYSICAL EXAM  GENERAL EXAM/CONSTITUTIONAL: Vitals:  Vitals:   11/21/22 0819  BP: 113/76  Pulse: 71  Weight: 124 lb (56.2 kg)  Height: 5\' 4"  (1.626 m)   Body mass index is 21.28 kg/m. Wt Readings from Last 3 Encounters:  11/21/22 124 lb (56.2 kg)  06/16/14 177 lb (80.3 kg)  06/08/14 176 lb (79.8 kg)   Patient is in no distress; well developed, nourished and groomed; neck is supple  CARDIOVASCULAR: Examination of carotid arteries is normal; no carotid bruits Regular rate and rhythm, no murmurs Examination of peripheral vascular system by observation and palpation is normal  EYES: Ophthalmoscopic exam of optic discs and posterior segments is normal; no papilledema or hemorrhages No results found.  MUSCULOSKELETAL: Gait, strength, tone, movements noted in Neurologic exam below  NEUROLOGIC: MENTAL STATUS:      No data to display         awake, alert, oriented to person, place and time recent and remote memory intact normal attention and concentration language fluent, comprehension intact, naming intact fund of knowledge appropriate  CRANIAL NERVE:  2nd - no papilledema on fundoscopic exam 2nd, 3rd, 4th, 6th - pupils equal and reactive to light, visual fields full to confrontation, extraocular muscles intact, no nystagmus 5th - facial sensation symmetric 7th - facial strength symmetric 8th - hearing intact 9th - palate elevates symmetrically, uvula midline 11th - shoulder shrug symmetric 12th - tongue protrusion midline  MOTOR:  normal bulk and tone, full strength in the BUE, BLE; EXCEPT LEFT FOOT DORSIFLEXION 4, EVERSION 4;  INVERSION AND PLANTAR FLEXION 5 SLIGHTLY LIMITED IN RIGHT DELTOID (ROTATOR CUFF PAIN)  SENSORY:  normal and symmetric to light touch, temperature, vibration  COORDINATION:  finger-nose-finger, fine finger movements normal  REFLEXES:  deep tendon reflexes 2+ and symmetric  GAIT/STATION:  narrow based gait; able to walk on toes; SLIGHT DIFF WITH HEEL GAIT ON LEFT FOOT     DIAGNOSTIC DATA (LABS, IMAGING, TESTING) - I reviewed patient records, labs, notes, testing and imaging myself where available.  No results found for: "WBC", "HGB", "HCT", "MCV", "PLT" No results found for: "NA", "K", "CL", "CO2", "GLUCOSE", "BUN", "CREATININE", "CALCIUM", "PROT", "ALBUMIN", "AST", "ALT", "ALKPHOS", "BILITOT", "GFRNONAA", "GFRAA" No  results found for: "CHOL", "HDL", "LDLCALC", "LDLDIRECT", "TRIG", "CHOLHDL" No results found for: "HGBA1C" No results found for: "VITAMINB12" No results found for: "TSH"     ASSESSMENT AND PLAN  57 y.o. year old female here with:   Dx:  1. Numbness and tingling     PLAN:  BILATERAL FOOT DROP (left worse than right; likely related to external compression at the knees, in the setting of 60lb weight loss over 1 year; improving in the 1 month) - avoid external leg compression; monitor for improvement; if not better in next 4-6 weeks, then consider EMG/NCS or other testing - reviewed foot / ankle exercises; could consider PT as well in future  THORACIC BACK NUMBNESS / COLD SENSATION - check MRI thoracic spine (rule out radiculopathy, myelopathy)  Orders Placed This Encounter  Procedures   MR THORACIC SPINE W WO CONTRAST   Return for pending if symptoms worsen or fail to improve, pending test results.    Suanne Marker, MD 11/21/2022, 9:33 AM Certified in Neurology, Neurophysiology and Neuroimaging  [facility] [address] [place], [place] [address] [phone]

## 2022-12-05 ENCOUNTER — Ambulatory Visit: Payer: Self-pay | Admitting: Diagnostic Neuroimaging

## 2023-06-01 ENCOUNTER — Telehealth (HOSPITAL_BASED_OUTPATIENT_CLINIC_OR_DEPARTMENT_OTHER): Payer: Self-pay | Admitting: Physical Therapy

## 2023-06-01 NOTE — Telephone Encounter (Signed)
 Called and LVM to remind patient of upcoming physical therapy evaluation appointment. Requested call back to confirm appointment attendance.

## 2023-06-03 ENCOUNTER — Encounter (HOSPITAL_BASED_OUTPATIENT_CLINIC_OR_DEPARTMENT_OTHER): Payer: Self-pay | Admitting: Physical Therapy

## 2023-06-03 ENCOUNTER — Other Ambulatory Visit: Payer: Self-pay

## 2023-06-03 ENCOUNTER — Ambulatory Visit (HOSPITAL_BASED_OUTPATIENT_CLINIC_OR_DEPARTMENT_OTHER): Payer: Self-pay | Attending: Sports Medicine | Admitting: Physical Therapy

## 2023-06-03 DIAGNOSIS — M25511 Pain in right shoulder: Secondary | ICD-10-CM | POA: Insufficient documentation

## 2023-06-03 DIAGNOSIS — M546 Pain in thoracic spine: Secondary | ICD-10-CM | POA: Diagnosis present

## 2023-06-03 DIAGNOSIS — M6281 Muscle weakness (generalized): Secondary | ICD-10-CM | POA: Diagnosis present

## 2023-06-03 DIAGNOSIS — R29898 Other symptoms and signs involving the musculoskeletal system: Secondary | ICD-10-CM | POA: Insufficient documentation

## 2023-06-03 NOTE — Therapy (Signed)
 OUTPATIENT PHYSICAL THERAPY SHOULDER EVALUATION   Patient Name: Kaitlin Black MRN: [id number] DOB:08-21-1965, 58 y.o., female Today's Date: 06/03/2023  END OF SESSION:  PT End of Session - 06/03/23 0907     Visit Number 1    Number of Visits 16    Date for PT Re-Evaluation 07/29/23    Authorization Type Aet[employer]    PT Start Time 0910    PT Stop Time 0950    PT Time Calculation (min) 40 min    Activity Tolerance Patient tolerated treatment well    Behavior During Therapy [facility] for tasks assessed/performed             Past Medical History:  Diagnosis Date   Allergy    Anxiety    Thyroid  disease    Past Surgical History:  Procedure Laterality Date   DILATION AND EVACUATION     x2   ENDOMETRIAL BIOPSY     LAPAROSCOPY     Patient Active Problem List   Diagnosis Date Noted   Plantar fasciitis 06/21/2017    PCP: Brenita Callow, L.Rozelle Corning, MD  REFERRING PROVIDER: Rance Burrows, MD  REFERRING DIAG: M75.51 (ICD-10-CM) - Bursitis of right shoulder M54.6 (ICD-10-CM) - Pain in thoracic spine  THERAPY DIAG:  Right shoulder pain, unspecified chronicity  Pain in thoracic spine  Muscle weakness (generalized)  Other symptoms and signs involving the musculoskeletal system  Rationale for Evaluation and Treatment: Rehabilitation  ONSET DATE: chronic   SUBJECTIVE:                                                                                                                                                                                      SUBJECTIVE STATEMENT: Patient states shoulder is feeling really good right now since cortisone shot. Typically the shots help for a few months and the pain comes back. Usually bothers her with lifting the arm. Sleeping was difficult. Burning across the back. Arm was aching all day before shot.  Hand dominance: Left  PERTINENT HISTORY: Hx anxiety, thyroid  disease  PAIN:  Are you having pain? No  PRECAUTIONS: None  RED  FLAGS: None   WEIGHT BEARING RESTRICTIONS: No  FALLS:  Has patient fallen in last 6 months? No  OCCUPATION:Teacher  PLOF: Independent  PATIENT GOALS:no more shoulder pain when the pain wears off, strengthen and fix up back, stay flexible   OBJECTIVE: (objective measures from initial evaluation unless otherwise dated)  PATIENT SURVEYS: UEFS 73/80  COGNITION: Overall cognitive status: Within functional limits for tasks assessed     SENSATION:WFL  POSTURE: rounded shoulders, forward head, and increased thoracic kyphosis   UPPER EXTREMITY ROM:   Active ROM Right eval  Left eval  Shoulder flexion 163 165  Shoulder extension    Shoulder abduction 160 160  Shoulder adduction    Shoulder internal rotation (functional) Mid infraspinatus Mid infraspinatus  Shoulder external rotation (functional) T5 T5  Elbow flexion    Elbow extension    Wrist flexion    Wrist extension    Wrist ulnar deviation    Wrist radial deviation    Wrist pronation    Wrist supination    (Blank rows = not tested) *=pain/symptoms  UPPER EXTREMITY MMT:  MMT Right eval Left eval  Shoulder flexion 4+ 4+  Shoulder extension    Shoulder abduction 4+ 4+  Shoulder adduction    Shoulder internal rotation 5 5  Shoulder external rotation 4+* 5  Middle trapezius    Lower trapezius    Elbow flexion 5 5  Elbow extension 5 5  Wrist flexion    Wrist extension    Wrist ulnar deviation    Wrist radial deviation    Wrist pronation    Wrist supination    Grip strength (lbs)    (Blank rows = not tested) *=pain/symptoms   JOINT MOBILITY TESTING:  WFL bilaterally  PALPATION:  Minimal to no tenderness in bilateral shoulder, hyperactive pec bilaterally, hyperactive and tender R infraspinatus, hypomobile thoracic spine   TODAY'S TREATMENT:                                                                                                                                         DATE:  06/03/23 Standing  bilateral ER RTB 2 x 10 Standing row RTB 2 x 10 Doorway pec stretch 3 x 20 second holds  PATIENT EDUCATION:  Education details: Patient educated on exam findings, POC, scope of PT, HEP, relevant anatomy and biomechanics. Person educated: Patient Education method: Explanation, Demonstration, and Handouts Education comprehension: verbalized understanding, returned demonstration, verbal cues required, and tactile cues required  HOME EXERCISE PROGRAM: Access Code: [email or web address] URL: [email or web address] Date: 06/03/2023 Prepared by: Debria Fang Nolen Lindamood  Exercises - Shoulder External Rotation and Scapular Retraction with Resistance  - 1 x daily - 7 x weekly - 2 sets - 10 reps - Standing Shoulder Row with Anchored Resistance  - 1 x daily - 7 x weekly - 2 sets - 10 reps - Doorway Pec Stretch at 60 Elevation  - 1 x daily - 7 x weekly - 3 reps - 20 second hold  ASSESSMENT:  CLINICAL IMPRESSION: Patient a 58 y.o. y.o. female who was seen today for physical therapy evaluation and treatment for R shoulder and thoracic spine pain. Patient presents with pain limited deficits in R shoulder and thoracic spine strength, ROM, endurance, activity tolerance, posture, and functional mobility with ADL. Patient is having to modify and restrict ADL as indicated by outcome measure score as well as subjective information and objective measures which is affecting overall participation. Patient will benefit from skilled physical  therapy in order to improve function and reduce impairment.  OBJECTIVE IMPAIRMENTS: decreased endurance, decreased mobility, decreased ROM, decreased strength, increased muscle spasms, impaired flexibility, improper body mechanics, postural dysfunction, and pain.   ACTIVITY LIMITATIONS: carrying, lifting, bending, reach over head, hygiene/grooming, locomotion level, and caring for others  PARTICIPATION LIMITATIONS: meal prep, cleaning, laundry, shopping, community activity,  occupation, and yard work  PERSONAL FACTORS: Time since onset of injury/illness/exacerbation are also affecting patient's functional outcome.   REHAB POTENTIAL: Good  CLINICAL DECISION MAKING: Stable/uncomplicated  EVALUATION COMPLEXITY: Low   GOALS: Goals reviewed with patient? Yes  SHORT TERM GOALS: Target date: 07/01/2023    Patient will be independent with HEP in order to improve functional outcomes. Baseline: Goal status: INITIAL  2.  Patient will report at least 25% improvement in symptoms for improved quality of life. Baseline:  Goal status: INITIAL   LONG TERM GOALS: Target date: 07/29/2023    Patient will report at least 75% improvement in symptoms for improved quality of life. Baseline:  Goal status: INITIAL  2.  Patient will improve UEFS score by at least 5 points in order to indicate improved tolerance to activity. Baseline:  Goal status: INITIAL  3.  Patient will demonstrate grade of 5/5 MMT grade in all tested musculature as evidence of improved strength to assist with lifting at home. Baseline:  Goal status: INITIAL  4.  Patient will be able to return to all activities unrestricted for improved ability to perform work functions, exercise, and participate with family.  Baseline:  Goal status: INITIAL    PLAN:  PT FREQUENCY: 1-2x/week  PT DURATION: 8 weeks  PLANNED INTERVENTIONS: 97164- PT Re-evaluation, 97110-Therapeutic exercises, 97530- Therapeutic activity, 97112- Neuromuscular re-education, 97535- Self Care, 57846- Manual therapy, [phone]- Gait training, [phone]- Orthotic Fit/training, [phone]- Canalith repositioning, [id number]- Aquatic Therapy, [phone]- Splinting, Patient/Family education, Balance training, Stair training, Taping, Dry Needling, Joint mobilization, Joint manipulation, Spinal manipulation, Spinal mobilization, Scar mobilization, and DME instructions.  PLAN FOR NEXT SESSION: shoulder, RC, and periscap strength, postural strength; thoracic  mobility   Beather Liming, PT 06/03/2023, 9:57 AM

## 2023-07-06 ENCOUNTER — Encounter (HOSPITAL_BASED_OUTPATIENT_CLINIC_OR_DEPARTMENT_OTHER): Admitting: Physical Therapy

## 2023-07-12 ENCOUNTER — Encounter (HOSPITAL_BASED_OUTPATIENT_CLINIC_OR_DEPARTMENT_OTHER): Payer: Self-pay | Admitting: Physical Therapy

## 2023-07-12 ENCOUNTER — Ambulatory Visit (HOSPITAL_BASED_OUTPATIENT_CLINIC_OR_DEPARTMENT_OTHER): Attending: Sports Medicine | Admitting: Physical Therapy

## 2023-07-12 DIAGNOSIS — R29898 Other symptoms and signs involving the musculoskeletal system: Secondary | ICD-10-CM | POA: Insufficient documentation

## 2023-07-12 DIAGNOSIS — M25511 Pain in right shoulder: Secondary | ICD-10-CM | POA: Insufficient documentation

## 2023-07-12 DIAGNOSIS — M546 Pain in thoracic spine: Secondary | ICD-10-CM | POA: Diagnosis present

## 2023-07-12 DIAGNOSIS — M6281 Muscle weakness (generalized): Secondary | ICD-10-CM | POA: Diagnosis present

## 2023-07-12 NOTE — Therapy (Signed)
 OUTPATIENT PHYSICAL THERAPY TREATMENT   Patient Name: Kaitlin Black MRN: [id number] DOB:January 11, 1966, 58 y.o., female Today's Date: 07/12/2023  END OF SESSION:  PT End of Session - 07/12/23 1024     Visit Number 2    Number of Visits 16    Date for PT Re-Evaluation 07/29/23    Authorization Type [employer]et[employer]    PT Start Time 1022    PT Stop Time 1100    PT Time Calculation (min) 38 min    Activity Tolerance Patient tolerated treatment well    Behavior During Therapy WFL for tasks assessed/performed             Past Medical History:  Diagnosis Date   Allergy    Anxiety    Thyroid  disease    Past Surgical History:  Procedure Laterality Date   DILATION AND EVACUATION     x2   ENDOMETRIAL BIOPSY     LAPAROSCOPY     Patient Active Problem List   Diagnosis Date Noted   Plantar fasciitis 06/21/2017    PCP: Brenita Callow, L.Rozelle Corning, MD  REFERRING PROVIDER: Rance Burrows, MD  REFERRING DIAG: M75.51 (ICD-10-CM) - Bursitis of right shoulder M54.6 (ICD-10-CM) - Pain in thoracic spine  THERAPY DIAG:  Right shoulder pain, unspecified chronicity  Pain in thoracic spine  Muscle weakness (generalized)  Other symptoms and signs involving the musculoskeletal system  Rationale for Evaluation and Treatment: Rehabilitation  ONSET DATE: chronic   SUBJECTIVE:                                                                                                                                                                                      SUBJECTIVE STATEMENT: Patient states cortisone shot might be wearing off some. Band is easy, doesn't feel like its doing much.   EVAL: Patient states shoulder is feeling really good right now since cortisone shot. Typically the shots help for a few months and the pain comes back. Usually bothers her with lifting the arm. Sleeping was difficult. Burning across the back. Arm was aching all day before shot.  Hand dominance: Left  PERTINENT  HISTORY: Hx anxiety, thyroid  disease  PAIN:  Are you having pain? No  PRECAUTIONS: None  RED FLAGS: None   WEIGHT BEARING RESTRICTIONS: No  FALLS:  Has patient fallen in last 6 months? No  OCCUPATION:Teacher  PLOF: Independent  PATIENT GOALS:no more shoulder pain when the pain wears off, strengthen and fix up back, stay flexible   OBJECTIVE: (objective measures from initial evaluation unless otherwise dated)  PATIENT SURVEYS: UEFS 73/80  COGNITION: Overall cognitive status: Within functional limits for tasks assessed     SENSATION:WFL  POSTURE: rounded shoulders, forward head, and increased thoracic kyphosis   UPPER EXTREMITY ROM:   Active ROM Right eval Left eval  Shoulder flexion 163 165  Shoulder extension    Shoulder abduction 160 160  Shoulder adduction    Shoulder internal rotation (functional) Mid infraspinatus Mid infraspinatus  Shoulder external rotation (functional) T5 T5  Elbow flexion    Elbow extension    Wrist flexion    Wrist extension    Wrist ulnar deviation    Wrist radial deviation    Wrist pronation    Wrist supination    (Blank rows = not tested) *=pain/symptoms  UPPER EXTREMITY MMT:  MMT Right eval Left eval  Shoulder flexion 4+ 4+  Shoulder extension    Shoulder abduction 4+ 4+  Shoulder adduction    Shoulder internal rotation 5 5  Shoulder external rotation 4+* 5  Middle trapezius    Lower trapezius    Elbow flexion 5 5  Elbow extension 5 5  Wrist flexion    Wrist extension    Wrist ulnar deviation    Wrist radial deviation    Wrist pronation    Wrist supination    Grip strength (lbs)    (Blank rows = not tested) *=pain/symptoms   JOINT MOBILITY TESTING:  WFL bilaterally  PALPATION:  Minimal to no tenderness in bilateral shoulder, hyperactive pec bilaterally, hyperactive and tender R infraspinatus, hypomobile thoracic spine   TODAY'S TREATMENT:                                                                                                                                          DATE:  07/12/23 UBE 3 minutes retro level 2.5 for dynamic warm up Standing row BTB 3 x 10-15 Standing shoulder extension BTB 2 x 10 Standing bilateral ER GTB 3 x 10 Standing horizontal abduction GTB  2 x 10  Doorway pec stretch 3 x 20 second holds Seated thoracic extension over chair 10 x 5 -10 second   06/03/23 Standing bilateral ER RTB 2 x 10 Standing row RTB 2 x 10 Doorway pec stretch 3 x 20 second holds  PATIENT EDUCATION:  Education details: Patient educated on exam findings, POC, scope of PT, HEP, relevant anatomy and biomechanics. 07/12/23: HEP, exercise mechanics Person educated: Patient Education method: Explanation, Demonstration, and Handouts Education comprehension: verbalized understanding, returned demonstration, verbal cues required, and tactile cues required  HOME EXERCISE PROGRAM: Access Code: [email or web address] URL: [email or web address] Date: 06/03/2023 Prepared by: Debria Fang Talar Fraley  Exercises - Shoulder External Rotation and Scapular Retraction with Resistance  - 1 x daily - 7 x weekly - 2 sets - 10 reps - Standing Shoulder Row with Anchored Resistance  - 1 x daily - 7 x weekly - 2 sets - 10 reps - Doorway Pec Stretch at 60 Elevation  - 1 x daily - 7 x weekly - 3 reps - 20 second hold  ASSESSMENT:  CLINICAL IMPRESSION: UBE for dynamic warm up. Continued progressive postural and shoulder strengthening which is tolerated well. Patient will continue to benefit from physical therapy in order to improve function and reduce impairment.   OBJECTIVE IMPAIRMENTS: decreased endurance, decreased mobility, decreased ROM, decreased strength, increased muscle spasms, impaired flexibility, improper body mechanics, postural dysfunction, and pain.   ACTIVITY LIMITATIONS: carrying, lifting, bending, reach over head, hygiene/grooming, locomotion level, and caring for others  PARTICIPATION LIMITATIONS:  meal prep, cleaning, laundry, shopping, community activity, occupation, and yard work  PERSONAL FACTORS: Time since onset of injury/illness/exacerbation are also affecting patient's functional outcome.   REHAB POTENTIAL: Good  CLINICAL DECISION MAKING: Stable/uncomplicated  EVALUATION COMPLEXITY: Low   GOALS: Goals reviewed with patient? Yes  SHORT TERM GOALS: Target date: 07/01/2023    Patient will be independent with HEP in order to improve functional outcomes. Baseline: Goal status: INITIAL  2.  Patient will report at least 25% improvement in symptoms for improved quality of life. Baseline:  Goal status: INITIAL   LONG TERM GOALS: Target date: 07/29/2023    Patient will report at least 75% improvement in symptoms for improved quality of life. Baseline:  Goal status: INITIAL  2.  Patient will improve UEFS score by at least 5 points in order to indicate improved tolerance to activity. Baseline:  Goal status: INITIAL  3.  Patient will demonstrate grade of 5/5 MMT grade in all tested musculature as evidence of improved strength to assist with lifting at home. Baseline:  Goal status: INITIAL  4.  Patient will be able to return to all activities unrestricted for improved ability to perform work functions, exercise, and participate with family.  Baseline:  Goal status: INITIAL    PLAN:  PT FREQUENCY: 1-2x/week  PT DURATION: 8 weeks  PLANNED INTERVENTIONS: 97164- PT Re-evaluation, 97110-Therapeutic exercises, 97530- Therapeutic activity, 97112- Neuromuscular re-education, 97535- Self Care, 69629- Manual therapy, [phone]- Gait training, [phone]- Orthotic Fit/training, [phone]- Canalith repositioning, [id number]- Aquatic Therapy, [phone]- Splinting, Patient/Family education, Balance training, Stair training, Taping, Dry Needling, Joint mobilization, Joint manipulation, Spinal manipulation, Spinal mobilization, Scar mobilization, and DME instructions.  PLAN FOR NEXT SESSION: shoulder,  RC, and periscap strength, postural strength; thoracic mobility   Perfecto Bracket Brayley Mackowiak, PT 07/12/2023, 10:25 AM

## 2023-07-19 ENCOUNTER — Encounter (HOSPITAL_BASED_OUTPATIENT_CLINIC_OR_DEPARTMENT_OTHER): Admitting: Physical Therapy

## 2023-07-24 ENCOUNTER — Encounter (HOSPITAL_BASED_OUTPATIENT_CLINIC_OR_DEPARTMENT_OTHER): Payer: Self-pay | Admitting: Physical Therapy

## 2023-07-25 ENCOUNTER — Encounter (HOSPITAL_BASED_OUTPATIENT_CLINIC_OR_DEPARTMENT_OTHER): Payer: Self-pay | Admitting: Physical Therapy

## 2023-07-25 NOTE — Therapy (Signed)
 [facility] Outpatient Rehabilitation at [facility] [address] [place], [place], [address] Phone: [phone]   Fax:  [phone]  Patient Details  Name: Kaitlin Black MRN: [id number] Date of Birth: 07/01/1965 Referring Provider:  No ref. provider found  Encounter Date: 07/25/2023   PHYSICAL THERAPY DISCHARGE SUMMARY  Visits from Start of Care: 2  Current functional level related to goals / functional outcomes: Unable to assess as patient has not returned. Patient stated symptoms have resolved.    Remaining deficits: Unable to assess as patient has not returned.    Education / Equipment: HEP   Patient agrees to discharge. Patient goals were unable to reassess. Patient is being discharged due to not returning since the last visit.   2:46 PM, 07/25/23 Prentice CANDIE Stains PT, DPT Physical Therapist at [facility] Outpatient Rehabilitation at [facility] [address] [place], [place], [address] Phone: [phone]   Fax:  [phone]

## 2023-07-26 ENCOUNTER — Encounter (HOSPITAL_BASED_OUTPATIENT_CLINIC_OR_DEPARTMENT_OTHER)

## 2023-09-30 ENCOUNTER — Other Ambulatory Visit: Payer: Self-pay | Admitting: Medical Genetics

## 2024-02-14 ENCOUNTER — Other Ambulatory Visit (HOSPITAL_COMMUNITY)
Admission: RE | Admit: 2024-02-14 | Discharge: 2024-02-14 | Disposition: A | Discharge status: Home / Self Care | Payer: Self-pay | Source: Ambulatory Visit | Attending: Medical Genetics | Admitting: Medical Genetics

## 2024-02-23 LAB — GENECONNECT MOLECULAR SCREEN: Genetic Analysis Overall Interpretation: NEGATIVE
